# Patient Record
Sex: Male | Born: 1960 | Race: White | Hispanic: No | Smoking: Current every day smoker
Health system: Southern US, Community
[De-identification: ages and names within clinical notes are randomized; demographics above are authoritative.]

---

## 2014-04-17 ENCOUNTER — Emergency Department (HOSPITAL_COMMUNITY): Payer: Self-pay

## 2014-04-17 ENCOUNTER — Emergency Department (HOSPITAL_COMMUNITY)
Admission: EM | Admit: 2014-04-17 | Discharge: 2014-04-17 | Disposition: A | Discharge status: Home / Self Care | Payer: Self-pay | Attending: Emergency Medicine | Admitting: Emergency Medicine

## 2014-04-17 ENCOUNTER — Encounter (HOSPITAL_COMMUNITY): Payer: Self-pay | Admitting: Emergency Medicine

## 2014-04-17 DIAGNOSIS — F3289 Other specified depressive episodes: Secondary | ICD-10-CM | POA: Insufficient documentation

## 2014-04-17 DIAGNOSIS — R079 Chest pain, unspecified: Secondary | ICD-10-CM | POA: Insufficient documentation

## 2014-04-17 DIAGNOSIS — F411 Generalized anxiety disorder: Secondary | ICD-10-CM | POA: Insufficient documentation

## 2014-04-17 DIAGNOSIS — F39 Unspecified mood [affective] disorder: Secondary | ICD-10-CM | POA: Insufficient documentation

## 2014-04-17 DIAGNOSIS — F17200 Nicotine dependence, unspecified, uncomplicated: Secondary | ICD-10-CM | POA: Insufficient documentation

## 2014-04-17 DIAGNOSIS — F329 Major depressive disorder, single episode, unspecified: Secondary | ICD-10-CM

## 2014-04-17 DIAGNOSIS — F32A Depression, unspecified: Secondary | ICD-10-CM

## 2014-04-17 DIAGNOSIS — Z87828 Personal history of other (healed) physical injury and trauma: Secondary | ICD-10-CM | POA: Insufficient documentation

## 2014-04-17 DIAGNOSIS — G479 Sleep disorder, unspecified: Secondary | ICD-10-CM | POA: Insufficient documentation

## 2014-04-17 LAB — COMPREHENSIVE METABOLIC PANEL
ALBUMIN: 4 g/dL (ref 3.5–5.2)
ALK PHOS: 91 U/L (ref 39–117)
ALT: 14 U/L (ref 0–53)
ANION GAP: 15 (ref 5–15)
AST: 14 U/L (ref 0–37)
BILIRUBIN TOTAL: 0.7 mg/dL (ref 0.3–1.2)
BUN: 8 mg/dL (ref 6–23)
CHLORIDE: 102 meq/L (ref 96–112)
CO2: 21 meq/L (ref 19–32)
Calcium: 8.8 mg/dL (ref 8.4–10.5)
Creatinine, Ser: 0.86 mg/dL (ref 0.50–1.35)
GFR calc Af Amer: >90 mL/min (ref >90)
GFR calc non Af Amer: >90 mL/min (ref >90)
Glucose, Bld: 99 mg/dL (ref 70–99)
Potassium: 3.8 mEq/L (ref 3.7–5.3)
Sodium: 138 mEq/L (ref 137–147)
Total Protein: 7 g/dL (ref 6.0–8.3)

## 2014-04-17 LAB — ETHANOL: Alcohol, Ethyl (B): <11 mg/dL (ref 0–11)

## 2014-04-17 LAB — RAPID URINE DRUG SCREEN, HOSP PERFORMED
AMPHETAMINES: NOT DETECTED
Barbiturates: NOT DETECTED
Benzodiazepines: NOT DETECTED
COCAINE: NOT DETECTED
Opiates: NOT DETECTED
TETRAHYDROCANNABINOL: NOT DETECTED

## 2014-04-17 LAB — I-STAT TROPONIN, ED: Troponin i, poc: 0 ng/mL (ref 0.00–0.08)

## 2014-04-17 LAB — CBC
HEMATOCRIT: 48.6 % (ref 39.0–52.0)
Hemoglobin: 16.8 g/dL (ref 13.0–17.0)
MCH: 29.1 pg (ref 26.0–34.0)
MCHC: 34.6 g/dL (ref 30.0–36.0)
MCV: 84.2 fL (ref 78.0–100.0)
Platelets: 178 10*3/uL (ref 150–400)
RBC: 5.77 MIL/uL (ref 4.22–5.81)
RDW: 14.6 % (ref 11.5–15.5)
WBC: 8.5 10*3/uL (ref 4.0–10.5)

## 2014-04-17 LAB — ACETAMINOPHEN LEVEL: Acetaminophen (Tylenol), Serum: <15 ug/mL (ref 10–30)

## 2014-04-17 LAB — SALICYLATE LEVEL: Salicylate Lvl: <2 mg/dL — ABNORMAL LOW (ref 2.8–20.0)

## 2014-04-17 MED ORDER — ONDANSETRON HCL 4 MG PO TABS: 4.0000 mg | ORAL_TABLET | Freq: Three times a day (TID) | ORAL | Status: DC | PRN

## 2014-04-17 MED ORDER — ACETAMINOPHEN 325 MG PO TABS: 650.0000 mg | ORAL_TABLET | ORAL | Status: DC | PRN

## 2014-04-17 MED ORDER — IBUPROFEN 400 MG PO TABS: 600.0000 mg | ORAL_TABLET | Freq: Three times a day (TID) | ORAL | Status: DC | PRN

## 2014-04-17 MED ORDER — LORAZEPAM 1 MG PO TABS: 1.0000 mg | ORAL_TABLET | Freq: Three times a day (TID) | ORAL | Status: DC | PRN

## 2014-04-17 MED ORDER — NICOTINE 21 MG/24HR TD PT24: 21.0000 mg | MEDICATED_PATCH | Freq: Every day | TRANSDERMAL | Status: DC

## 2014-04-17 MED ORDER — ALUM & MAG HYDROXIDE-SIMETH 200-200-20 MG/5ML PO SUSP: 30.0000 mL | ORAL | Status: DC | PRN

## 2014-04-17 MED ORDER — ZOLPIDEM TARTRATE 5 MG PO TABS: 5.0000 mg | ORAL_TABLET | Freq: Every evening | ORAL | Status: DC | PRN

## 2014-04-17 NOTE — ED Provider Notes (Signed)
CSN: [id number]     Arrival date & time 04/17/14  1625 History   First MD Initiated Contact with Patient 04/17/14 1657     Chief Complaint  Patient presents with  . Depression     (Consider location/radiation/quality/duration/timing/severity/associated sxs/prior Treatment) HPI Comments: Patient is a 53 yo M PMHx significant for tobacco use presenting to the ED from [facility] for evaluation of depression and anxiety. Patient states he has been dealing with increased depression, anxiety since being injured at work in May of this year. Patient states he shared his concerns to his orthopedist today, "stating I am concerned what would happen to his daughter if something happened to me." He was advised to come over for evaluation, patient is agreeable to this plan and is here voluntarily. Patient denies SI, HI, hallucinations, self injury, RD or ETOH use. Patient states he has had central chest pain since the injury, worsened with movement and lifting. Mildly alleviated with NSAIDs.    History reviewed. No pertinent past medical history. History reviewed. No pertinent past surgical history. No family history on file. History  Substance Use Topics  . Smoking status: Current Every Day Smoker  . Smokeless tobacco: Not on file  . Alcohol Use: No    Review of Systems  Constitutional: Negative for fever and chills.  Cardiovascular: Positive for chest pain.  Psychiatric/Behavioral: Positive for sleep disturbance, dysphoric mood and decreased concentration. Negative for suicidal ideas, hallucinations and self-injury. The patient is nervous/anxious.   All other systems reviewed and are negative.     Allergies  Review of patient's allergies indicates no known allergies.  Home Medications   Prior to Admission medications   Medication Sig Start Date End Date Taking? Authorizing Provider  ibuprofen (ADVIL,MOTRIN) 200 MG tablet Take 400 mg by mouth every 6 (six) hours as needed for  headache or mild pain.   Yes Historical Provider, MD   BP 146/83  Pulse 77  Temp(Src) 99.3 F (37.4 C) (Oral)  Resp 20  SpO2 100% Physical Exam  Nursing note and vitals reviewed. Constitutional: He is oriented to person, place, and time. He appears well-developed and well-nourished. He is cooperative.  Non-toxic appearance. No distress.  HENT:  Head: Normocephalic and atraumatic.  Right Ear: External ear normal.  Left Ear: External ear normal.  Nose: Nose normal.  Mouth/Throat: Oropharynx is clear and moist.  Eyes: Conjunctivae are normal.  Neck: Normal range of motion. Neck supple.  Cardiovascular: Normal rate, regular rhythm, normal heart sounds and intact distal pulses.   Pulmonary/Chest: Effort normal and breath sounds normal. He exhibits tenderness.  Abdominal: Soft. There is no tenderness.  Musculoskeletal: Normal range of motion.  Neurological: He is alert and oriented to person, place, and time.  Skin: Skin is warm and dry. He is not diaphoretic.  Psychiatric: His behavior is normal. He exhibits a depressed mood. He expresses no homicidal and no suicidal ideation. He expresses no suicidal plans and no homicidal plans.    ED Course  Procedures (including critical care time) Medications - No data to display  Labs Review Labs Reviewed  SALICYLATE LEVEL - Abnormal; Notable for the following:    Salicylate Lvl <2.0 (*)    All other components within normal limits  CBC  COMPREHENSIVE METABOLIC PANEL  ETHANOL  ACETAMINOPHEN LEVEL  URINE RAPID DRUG SCREEN (HOSP PERFORMED)  Rosezena Sensor, ED    Imaging Review Dg Chest 2 View  04/17/2014   CLINICAL DATA:  Chest pain.  EXAM: CHEST  2 VIEW  COMPARISON:  None.  FINDINGS: Cardiomediastinal silhouette is unremarkable. The lungs are clear without pleural effusions or focal consolidations. Trachea projects midline and there is no pneumothorax. Soft tissue planes and included osseous structures are non-suspicious.  IMPRESSION:  No active cardiopulmonary disease.   Electronically Signed   By: Awilda Metro   On: 04/17/2014 17:46     EKG Interpretation   Date/Time:  Tuesday April 17 2014 18:17:12 EDT Ventricular Rate:  72 PR Interval:  130 QRS Duration: 100 QT Interval:  394 QTC Calculation: 431 R Axis:   56 Text Interpretation:  Normal sinus rhythm Possible Left atrial enlargement  Borderline ECG Sinus rhythm large P-wave Borderline ECG Confirmed by  Gerhard Munch  MD ([id number]) on 04/17/2014 7:29:44 PM      MDM   Final diagnoses:  Depression    Filed Vitals:   04/17/14 1837  BP: 146/83  Pulse: 77  Temp:   Resp: 20   Afebrile, NAD, non-toxic appearing, AAOx4. Patient presents to the ER with a number of risk factors for suicide for example the patient has a history of Depression, sleep disturbances. No SI, HI. In addition the patient has a number of protective factors for example the patient does not appear to be psychotic, is here voluntarily, is speaking openly about their current situation, discusses future plans & they have a good support system. Under these circumstances I would conservatively estimate the suicide risk to be low. Patient medically cleared. Current Plan is to have patient be evaluated by ACT. Patient decided to not wait for ACT team evaluation, given patient is not suicidal or homicidal currently will provide outpatient resources.  We have discussed that If the patient feels he was becoming unsafe, instead of acting on an impulse of self harm he will contact the crisis line, or return to the emergency department. Patient is stable at time of discharge       Jeannetta Ellis, PA-C 04/18/14 0025

## 2014-04-17 NOTE — ED Notes (Signed)
ekg to dr Jeraldine Loots

## 2014-04-17 NOTE — ED Notes (Signed)
Unknown when telepsych is to be done.  Pt made aware that per [facility] telepsych would be completed tonight but unknown time.

## 2014-04-17 NOTE — Discharge Instructions (Signed)
Please follow up with your primary care physician in 1-2 days. If you do not have one please call the [facility] number listed above. Please follow up with Behavioral Health to schedule a follow up appointment.  °Please read all discharge instructions and return precautions.  ° ° °Depression °Depression refers to feeling sad, low, down in the dumps, blue, gloomy, or empty. In general, there are two kinds of depression: °1. Normal sadness or normal grief. This kind of depression is one that we all feel from time to time after upsetting life experiences, such as the loss of a job or the ending of a relationship. This kind of depression is considered normal, is short lived, and resolves within a few days to 2 weeks. Depression experienced after the loss of a loved one (bereavement) often lasts longer than 2 weeks but normally gets better with time. °2. Clinical depression. This kind of depression lasts longer than normal sadness or normal grief or interferes with your ability to function at home, at work, and in school. It also interferes with your personal relationships. It affects almost every aspect of your life. Clinical depression is an illness. °Symptoms of depression can also be caused by conditions other than those mentioned above, such as: °· Physical illness. Some physical illnesses, including underactive thyroid gland (hypothyroidism), severe anemia, specific types of cancer, diabetes, uncontrolled seizures, heart and lung problems, strokes, and chronic pain are commonly associated with symptoms of depression. °· Side effects of some prescription medicine. In some people, certain types of medicine can cause symptoms of depression. °· Substance abuse. Abuse of alcohol and illicit drugs can cause symptoms of depression. °SYMPTOMS °Symptoms of normal sadness and normal grief include the following: °· Feeling sad or crying for short periods of time. °· Not caring about anything  (apathy). °· Difficulty sleeping or sleeping too much. °· No longer able to enjoy the things you used to enjoy. °· Desire to be by oneself all the time (social isolation). °· Lack of energy or motivation. °· Difficulty concentrating or remembering. °· Change in appetite or weight. °· Restlessness or agitation. °Symptoms of clinical depression include the same symptoms of normal sadness or normal grief and also the following symptoms: °· Feeling sad or crying all the time. °· Feelings of guilt or worthlessness. °· Feelings of hopelessness or helplessness. °· Thoughts of suicide or the desire to harm yourself (suicidal ideation). °· Loss of touch with reality (psychotic symptoms). Seeing or hearing things that are not real (hallucinations) or having false beliefs about your life or the people around you (delusions and paranoia). °DIAGNOSIS  °The diagnosis of clinical depression is usually based on how bad the symptoms are and how long they have lasted. Your health care provider will also ask you questions about your medical history and substance use to find out if physical illness, use of prescription medicine, or substance abuse is causing your depression. Your health care provider may also order blood tests. °TREATMENT  °Often, normal sadness and normal grief do not require treatment. However, sometimes antidepressant medicine is given for bereavement to ease the depressive symptoms until they resolve. °The treatment for clinical depression depends on how bad the symptoms are but often includes antidepressant medicine, counseling with a mental health professional, or both. Your health care provider will help to determine what treatment is best for you. °Depression caused by physical illness usually goes away with appropriate medical treatment of the illness. If prescription medicine is causing depression,   talk with your health care provider about stopping the medicine, decreasing the dose, or changing to another  medicine. °Depression caused by the abuse of alcohol or illicit drugs goes away when you stop using these substances. Some adults need professional help in order to stop drinking or using drugs. °SEEK IMMEDIATE MEDICAL CARE IF: °· You have thoughts about hurting yourself or others. °· You lose touch with reality (have psychotic symptoms). °· You are taking medicine for depression and have a serious side effect. °FOR MORE INFORMATION °· National Alliance on Mental Illness: www.nami.org  °· National Institute of Mental Health: www.nimh.nih.gov  °Document Released: 07/31/2000 Document Revised: 12/18/2013 Document Reviewed: 11/02/2011 °ExitCare® Patient Information ©2015 [employer]. This information is not intended to replace advice given to you by your health care provider. Make sure you discuss any questions you have with your health care provider. ° °If you do not have a primary care doctor to follow up with regarding today's visit, please call the [facility] at [phone] to make an appointment. Hours of operation are 10am - 7pm, Monday through Friday, and they have a sliding scale fee.  ° ° ° °RESOURCE GUIDE ° °Emergency Shelter:  [facility] (336) 271-5985 ° ° °Intensive Outpatient Programs: °[facility]      °[address] °[place], [place] °336-878-6098 °Both a day and evening program °      °[facility]     °[address]        °[place], [place] [address] °336-832-9800        ° °[facility] °[address] °[place] [employer] °336-882-2125 ° °[facility] °ACCESS LINE: 1-800-853-5163 or 336-641-4981 °[address] °[place], [place] [address] °Http://www.[facility]com/services/adult.htm ° ° °Substance Abuse Resources: °- [facility]  336-882-2125 °- [facility] 336-784-9470 °- [facility] 336-285-9073 °- [facility] 336-845-3988 °- Residential &  Outpatient Substance Abuse Program  800-659-3381 ° °Psychological Services: °-  Health  832-9600 °- [facility]  378-7881 °- [facility], [address], [place], ACCESS LINE: 1-800-853-5163 or 336-641-4981, Http://www.[facility].com/services/adult.htm ° °Mobile Crisis Teams:         °                               °[facility]         °Mobile Crisis Care Unit °1-877-626-1772       °      °Assertive °[facility] °[address] °336-834-9664 °                                        °[facility] °Sharon DeEsch °[address] °[place] °336-554-5454 ° °Self-Help/Support Groups: °[facility] Variety of support groups °373-1402 (call for more info) ° °Narcotics Anonymous (NA) °[facility] °[address] °[place] - 2 meetings at this location ° °Residential Treatment Programs:  °[facility]      °[address]        °[place]       °866-801-8205        ° °[facility] °[address] °[place], [place]  [address] °704-293-8524 ° °[facility]  °[address] °  [place], [place] [address] °336-845-3988 °Admissions: 8am-3pm M-F ° °[facility]     °[address]        °[place], [place] [address]       °336-841-1104        ° °[facility] °[address] °[place], [place] °336-379-7146 ° °[facility] °[address] °[place], [place] °336-285-9073 ° °[facility] Intensive Outpatient      °[address]     °[place], [place]       °852-3033        ° °[facility]     °[address] °[place], [place] °877-615-2722 or 336-784-9470 ° °[facility] ([facility] Medicaid °[address] °[place], [place] °336-227-7417 ° °[facility]                                               °[address] °[place] °800-659-3381 ° °[facility]: °[facility]- 1-888-581-9988              ° °General Therapy                                                °Julie Brannon, PhD        °[address]                                       °[place], [place] [address]         °336-349-5553   °Insurance ° °[facility]   °[address] °[place], [place] [address] °336-349-4454 ° °[facility] °[address]  °[place], [place] [address] °336-342-8316 °Insurance/Medicaid/sponsorship through [facility] ° °[facility]                                              °[address]                                        °[place], [place] [address]    °Therapy/tele-psych/case         °336-342-8316        °  °[facility] °[address]  °[place], [place]  [address]  °Adolescent/group home/case management °336-349-2233  °                                         °Julia Brannon PhD       °General therapy       °Insurance   °336-951-0000        ° °Dr. Arfeen, Insurance, M-F °336- 349-4544 ° ° ° ° ° °

## 2014-04-17 NOTE — ED Notes (Signed)
Pt to ED from [facility]- pt currently in rehab for lower back injury at work.  Pt reports he has been having trouble gathering his thoughts recently.  Admits to recent depression since injury and new thoughts of "what would happen if I was gone".  Denies HI, denies plan.  Pt calm and cooperative at present.

## 2014-04-17 NOTE — ED Notes (Signed)
Pt requesting to leave AMA due to wait of telepsych.  PA made aware.  Pt to be discharged home with resources.

## 2014-04-18 NOTE — ED Provider Notes (Signed)
  Medical screening examination/treatment/procedure(s) were performed by non-physician practitioner and as supervising physician I was immediately available for consultation/collaboration.   EKG Interpretation   Date/Time:  Tuesday April 17 2014 18:17:12 EDT Ventricular Rate:  72 PR Interval:  130 QRS Duration: 100 QT Interval:  394 QTC Calculation: 431 R Axis:   56 Text Interpretation:  Normal sinus rhythm Possible Left atrial enlargement  Borderline ECG Sinus rhythm large P-wave Borderline ECG Confirmed by  Gerhard Munch  MD ([id number]) on 04/17/2014 7:29:44 PM         Gerhard Munch, MD 04/18/14 [phone]

## 2016-02-25 IMAGING — CR DG CHEST 2V
2 series · 2 of 2 positions shown · non-contrast
Comparison: None.

CLINICAL DATA: Chest pain.

EXAM:
CHEST  2 VIEW

[w chest pa]
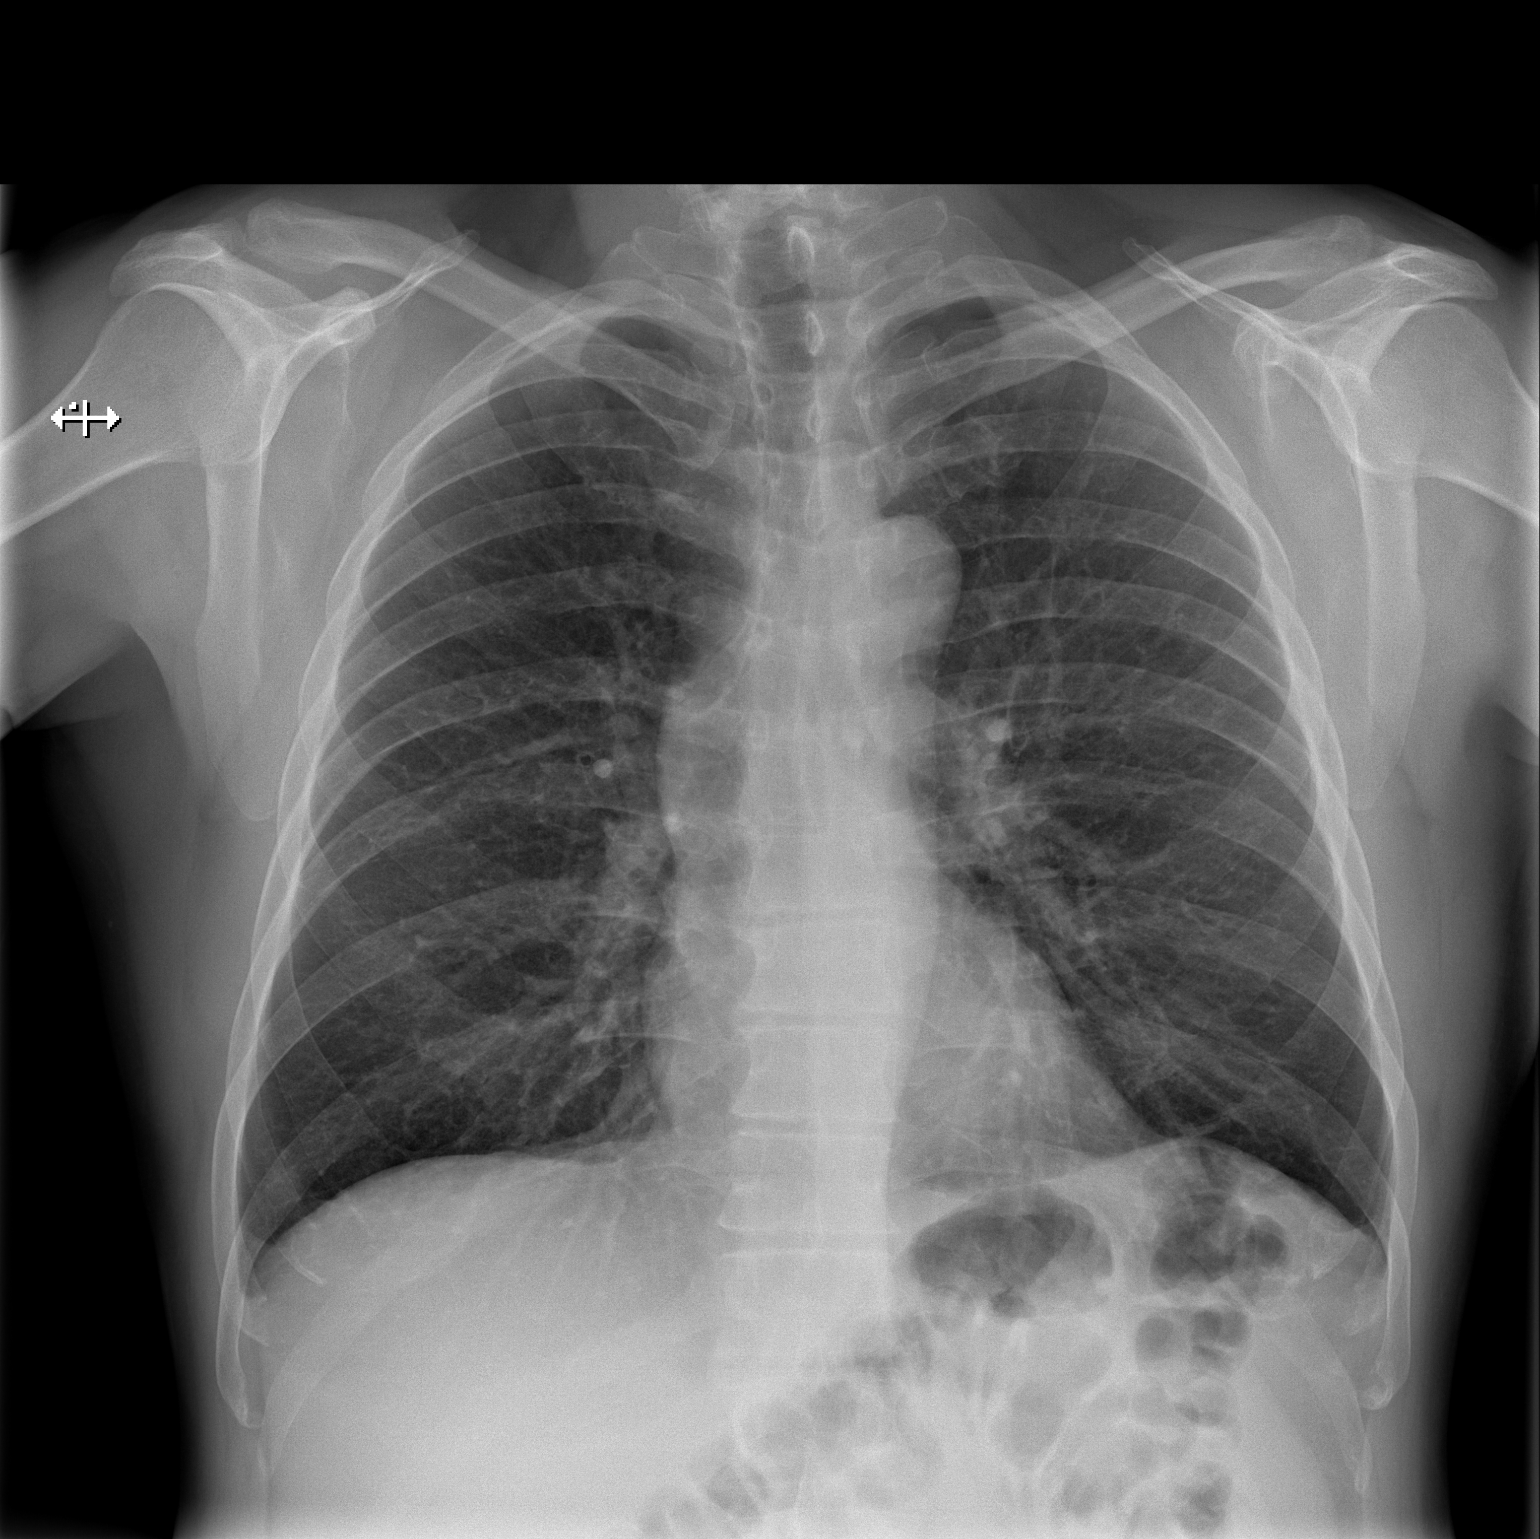

[w chest lat]
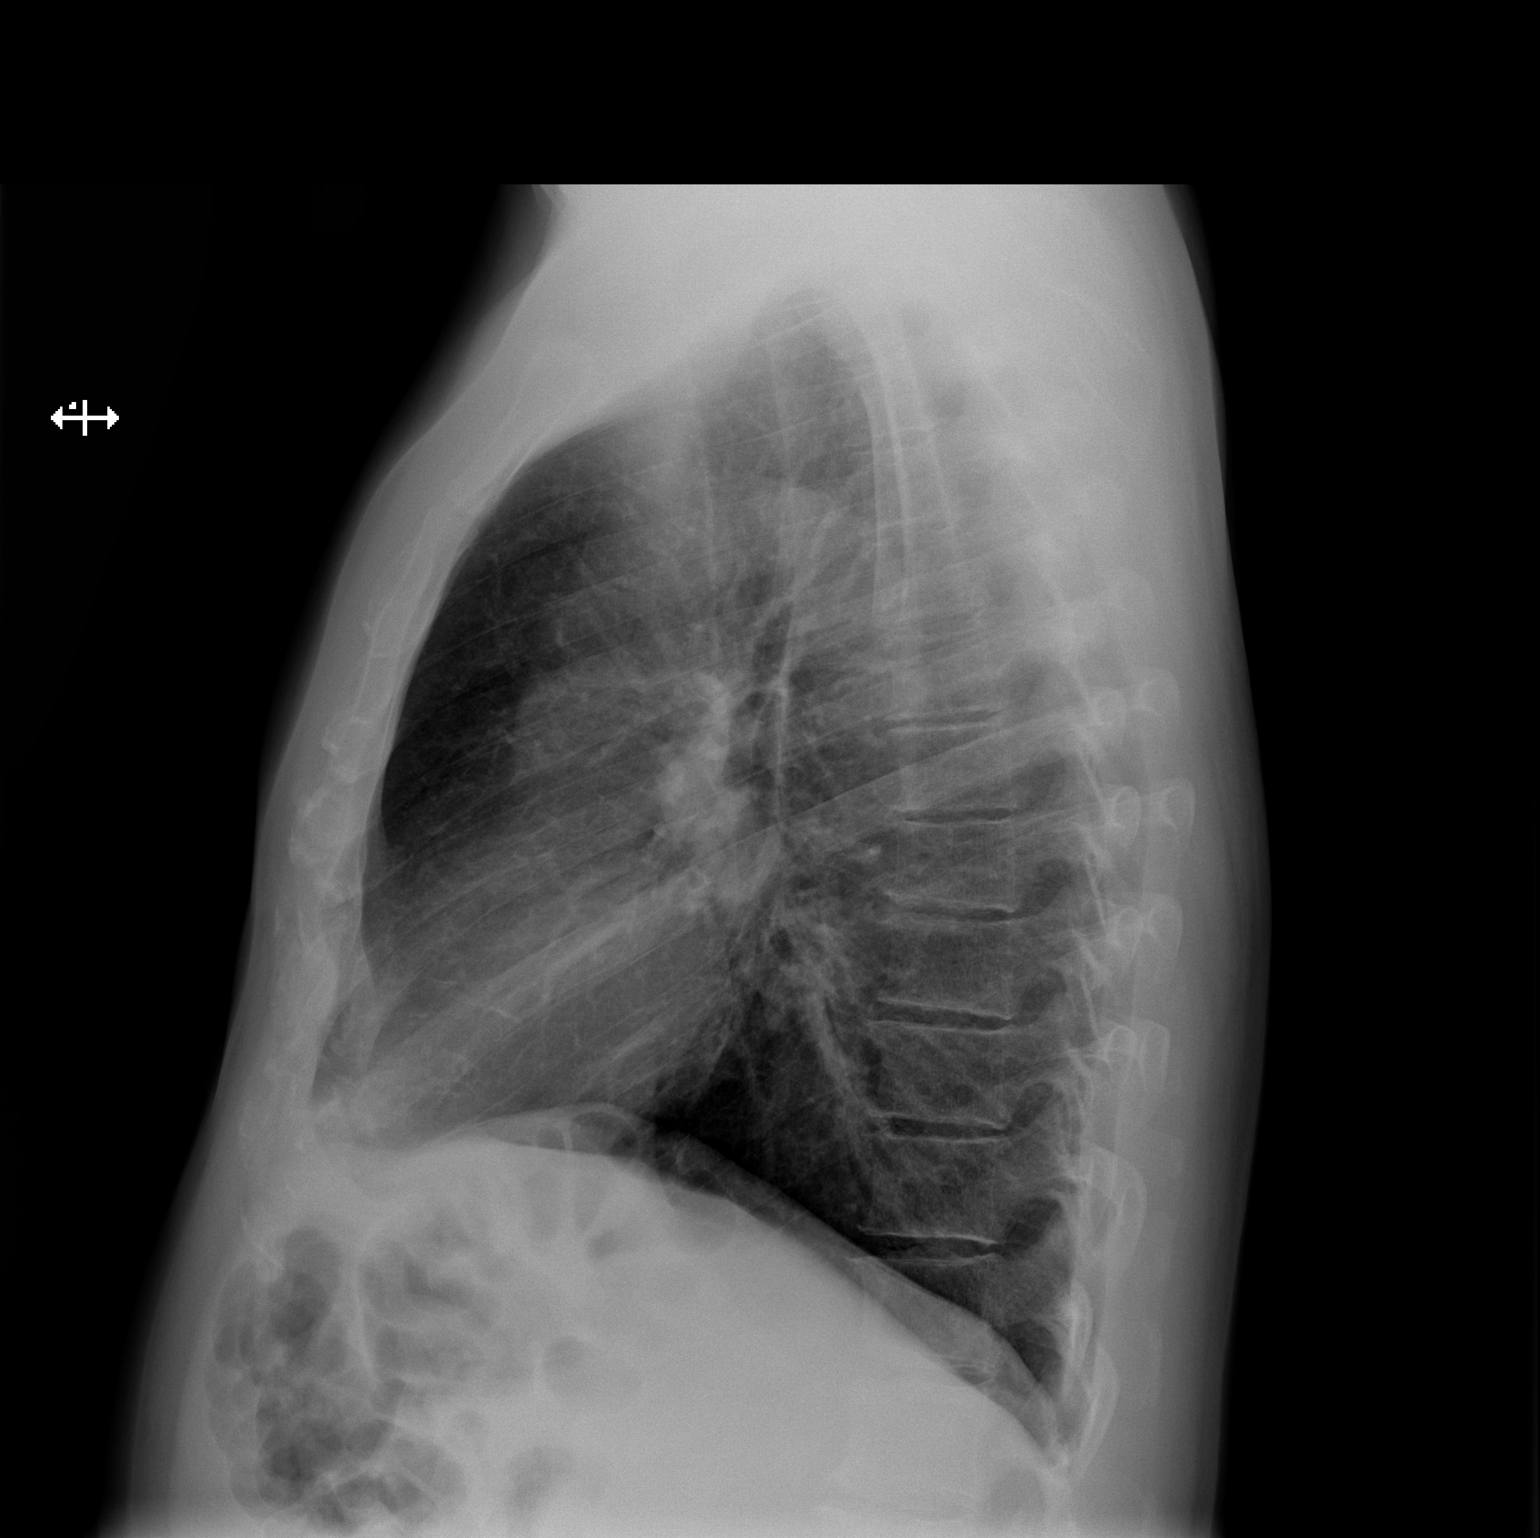

[2 of 2 positions shown; findings below may reference images not displayed]

FINDINGS: Cardiomediastinal silhouette is unremarkable. The lungs are clear
without pleural effusions or focal consolidations. Trachea projects
midline and there is no pneumothorax. Soft tissue planes and
included osseous structures are non-suspicious.
IMPRESSION: No active cardiopulmonary disease.

  By: Yeca Tegue
